# Patient Record
Sex: Female | Born: 1960 | Race: White | Hispanic: No | Marital: Married | Smoking: Former smoker
Health system: Southern US, Community
[De-identification: ages and names within clinical notes are randomized; demographics above are authoritative.]

---

## 2004-09-09 ENCOUNTER — Ambulatory Visit: Payer: Self-pay | Admitting: Family Medicine

## 2004-12-09 ENCOUNTER — Ambulatory Visit: Payer: Self-pay | Admitting: Family Medicine

## 2004-12-16 ENCOUNTER — Ambulatory Visit: Payer: Self-pay | Admitting: Family Medicine

## 2005-09-02 ENCOUNTER — Ambulatory Visit: Payer: Self-pay | Admitting: Family Medicine

## 2005-12-03 ENCOUNTER — Ambulatory Visit: Payer: Self-pay | Admitting: Family Medicine

## 2006-03-12 ENCOUNTER — Ambulatory Visit: Payer: Self-pay | Admitting: Family Medicine

## 2006-08-03 ENCOUNTER — Ambulatory Visit: Payer: Self-pay | Admitting: Family Medicine

## 2006-08-03 ENCOUNTER — Other Ambulatory Visit: Admission: RE | Admit: 2006-08-03 | Discharge: 2006-08-03 | Payer: Self-pay | Admitting: Family Medicine

## 2006-08-03 ENCOUNTER — Encounter (INDEPENDENT_AMBULATORY_CARE_PROVIDER_SITE_OTHER): Payer: Self-pay | Admitting: *Deleted

## 2006-08-03 LAB — CONVERTED CEMR LAB
ALT: 21 units/L (ref 0–40)
AST: 23 units/L (ref 0–37)
Alkaline Phosphatase: 54 units/L (ref 39–117)
BUN: 10 mg/dL (ref 6–23)
Basophils Relative: 0.4 % (ref 0.0–1.0)
Bilirubin, Direct: 0.1 mg/dL (ref 0.0–0.3)
CO2: 29 meq/L (ref 19–32)
Calcium: 9.1 mg/dL (ref 8.4–10.5)
Chloride: 100 meq/L (ref 96–112)
Cholesterol: 197 mg/dL (ref 0–200)
Eosinophils Relative: 2.7 % (ref 0.0–5.0)
GFR calc Af Amer: 117 mL/min
GFR calc non Af Amer: 97 mL/min
Glucose, Bld: 155 mg/dL — ABNORMAL HIGH (ref 70–99)
HDL: 37.8 mg/dL — ABNORMAL LOW (ref >39.0)
Hgb A1c MFr Bld: 9.9 %
Hgb A1c MFr Bld: 9.9 % — ABNORMAL HIGH (ref 4.6–6.0)
Microalb Creat Ratio: 1599.3 mg/g — ABNORMAL HIGH (ref 0.0–30.0)
Monocytes Relative: 6.6 % (ref 3.0–11.0)
Neutro Abs: 6.3 10*3/uL (ref 1.4–7.7)
Pap Smear: NORMAL
Platelets: 343 10*3/uL (ref 150–400)
RBC: 4.72 M/uL (ref 3.87–5.11)
TSH: 1.85 microintl units/mL (ref 0.35–5.50)
Total CHOL/HDL Ratio: 5.2
Total Protein: 6 g/dL (ref 6.0–8.3)
Triglycerides: 262 mg/dL (ref 0–149)
VLDL: 52 mg/dL — ABNORMAL HIGH (ref 0–40)
WBC: 9.7 10*3/uL (ref 4.5–10.5)

## 2006-08-10 ENCOUNTER — Encounter: Payer: Self-pay | Admitting: Family Medicine

## 2006-08-10 LAB — CONVERTED CEMR LAB: Protein, Ur: 3360 mg/24hr — ABNORMAL HIGH (ref 50–100)

## 2006-09-04 ENCOUNTER — Ambulatory Visit: Payer: Self-pay | Admitting: Family Medicine

## 2006-11-02 ENCOUNTER — Ambulatory Visit: Payer: Self-pay | Admitting: Family Medicine

## 2006-11-02 DIAGNOSIS — E7800 Pure hypercholesterolemia, unspecified: Secondary | ICD-10-CM

## 2006-11-02 DIAGNOSIS — E119 Type 2 diabetes mellitus without complications: Secondary | ICD-10-CM

## 2006-11-04 LAB — CONVERTED CEMR LAB
ALT: 22 units/L (ref 0–40)
AST: 17 units/L (ref 0–37)
Albumin: 3.1 g/dL — ABNORMAL LOW (ref 3.5–5.2)
BUN: 8 mg/dL (ref 6–23)
CO2: 28 meq/L (ref 19–32)
Calcium: 8.9 mg/dL (ref 8.4–10.5)
Chloride: 108 meq/L (ref 96–112)
Cholesterol: 181 mg/dL (ref 0–200)
Creatinine, Ser: 0.7 mg/dL (ref 0.4–1.2)
GFR calc non Af Amer: 97 mL/min
Hgb A1c MFr Bld: 9.3 % — ABNORMAL HIGH (ref 4.6–6.0)

## 2006-11-12 ENCOUNTER — Encounter: Payer: Self-pay | Admitting: Family Medicine

## 2006-11-12 DIAGNOSIS — E669 Obesity, unspecified: Secondary | ICD-10-CM | POA: Insufficient documentation

## 2006-11-12 DIAGNOSIS — L2089 Other atopic dermatitis: Secondary | ICD-10-CM

## 2006-11-12 DIAGNOSIS — R809 Proteinuria, unspecified: Secondary | ICD-10-CM | POA: Insufficient documentation

## 2006-11-12 DIAGNOSIS — F3289 Other specified depressive episodes: Secondary | ICD-10-CM | POA: Insufficient documentation

## 2006-11-12 DIAGNOSIS — F329 Major depressive disorder, single episode, unspecified: Secondary | ICD-10-CM | POA: Insufficient documentation

## 2006-11-12 DIAGNOSIS — L259 Unspecified contact dermatitis, unspecified cause: Secondary | ICD-10-CM | POA: Insufficient documentation

## 2006-11-12 DIAGNOSIS — Z87891 Personal history of nicotine dependence: Secondary | ICD-10-CM

## 2006-11-12 DIAGNOSIS — I10 Essential (primary) hypertension: Secondary | ICD-10-CM | POA: Insufficient documentation

## 2007-01-05 ENCOUNTER — Ambulatory Visit: Payer: Self-pay | Admitting: Family Medicine

## 2007-01-13 ENCOUNTER — Encounter: Payer: Self-pay | Admitting: Family Medicine

## 2007-03-05 ENCOUNTER — Emergency Department: Payer: Self-pay | Admitting: Emergency Medicine

## 2007-08-27 ENCOUNTER — Ambulatory Visit: Payer: Self-pay | Admitting: Family Medicine

## 2007-08-31 LAB — CONVERTED CEMR LAB
AST: 17 units/L (ref 0–37)
BUN: 10 mg/dL (ref 6–23)
Basophils Relative: 0.4 % (ref 0.0–1.0)
Calcium: 8.7 mg/dL (ref 8.4–10.5)
Creatinine, Ser: 0.8 mg/dL (ref 0.4–1.2)
Eosinophils Relative: 3.1 % (ref 0.0–5.0)
Glucose, Bld: 224 mg/dL — ABNORMAL HIGH (ref 70–99)
HCT: 42 % (ref 36.0–46.0)
HDL: 32.4 mg/dL — ABNORMAL LOW (ref >39.0)
Hemoglobin: 13.7 g/dL (ref 12.0–15.0)
Hgb A1c MFr Bld: 8.8 % — ABNORMAL HIGH (ref 4.6–6.0)
Monocytes Absolute: 0.5 10*3/uL (ref 0.1–1.0)
Monocytes Relative: 6.8 % (ref 3.0–12.0)
Platelets: 304 10*3/uL (ref 150–400)
Potassium: 4.5 meq/L (ref 3.5–5.1)
RBC: 4.64 M/uL (ref 3.87–5.11)
Total CHOL/HDL Ratio: 5.2
WBC: 6.9 10*3/uL (ref 4.5–10.5)

## 2007-10-15 ENCOUNTER — Encounter: Payer: Self-pay | Admitting: Family Medicine

## 2007-10-15 ENCOUNTER — Telehealth: Payer: Self-pay | Admitting: Family Medicine

## 2007-10-19 ENCOUNTER — Encounter: Payer: Self-pay | Admitting: Family Medicine

## 2008-01-14 ENCOUNTER — Ambulatory Visit: Payer: Self-pay | Admitting: Family Medicine

## 2008-02-08 ENCOUNTER — Telehealth (INDEPENDENT_AMBULATORY_CARE_PROVIDER_SITE_OTHER): Payer: Self-pay | Admitting: *Deleted

## 2008-03-02 ENCOUNTER — Encounter: Payer: Self-pay | Admitting: Family Medicine

## 2008-03-16 ENCOUNTER — Ambulatory Visit: Payer: Self-pay | Admitting: Family Medicine
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
## 2008-08-28 ENCOUNTER — Telehealth: Payer: Self-pay | Admitting: Family Medicine

## 2008-11-14 ENCOUNTER — Telehealth: Payer: Self-pay | Admitting: Family Medicine

## 2008-11-23 ENCOUNTER — Encounter: Payer: Self-pay | Admitting: Family Medicine

## 2008-11-24 ENCOUNTER — Encounter: Payer: Self-pay | Admitting: Family Medicine

## 2008-11-29 ENCOUNTER — Ambulatory Visit: Payer: Self-pay | Admitting: Family Medicine

## 2008-12-28 ENCOUNTER — Telehealth: Payer: Self-pay | Admitting: Family Medicine

## 2009-05-15 ENCOUNTER — Telehealth: Payer: Self-pay | Admitting: Family Medicine

## 2010-01-02 ENCOUNTER — Emergency Department: Payer: Self-pay | Admitting: Emergency Medicine

## 2010-02-06 ENCOUNTER — Ambulatory Visit: Payer: Self-pay | Admitting: Unknown Physician Specialty

## 2010-02-18 ENCOUNTER — Ambulatory Visit: Payer: Self-pay | Admitting: Unknown Physician Specialty

## 2010-08-08 ENCOUNTER — Ambulatory Visit: Payer: Self-pay | Admitting: Unknown Physician Specialty

## 2010-08-22 ENCOUNTER — Other Ambulatory Visit: Payer: Self-pay | Admitting: Family Medicine

## 2011-02-11 ENCOUNTER — Ambulatory Visit: Payer: Self-pay | Admitting: Unknown Physician Specialty

## 2011-05-22 IMAGING — US ULTRASOUND CORE BIOPSY
1 series · 12 of 12 positions shown · non-contrast
Comparison: none

REASON FOR EXAM: L THYROID NODULE
COMMENTS:

PROCEDURE:     US  - US GUIDED BX/ASPIRATION NOT BR  - February 18, 2010  [DATE]
RESULT:
HISTORY: Left thyroid nodule.

[Series 1: ultrasound core biopsy · 12 of 12 slices shown]
[im 1/12]
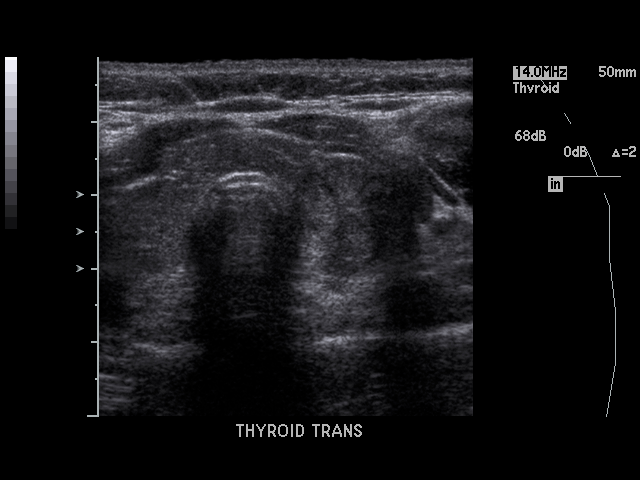
[im 2/12]
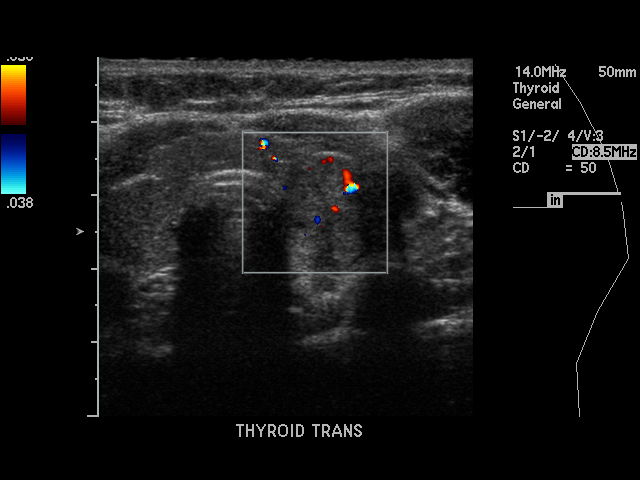
[im 3/12]
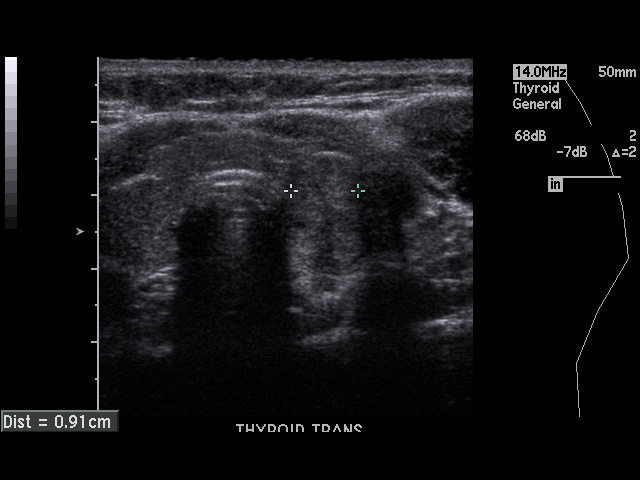
[im 4/12]
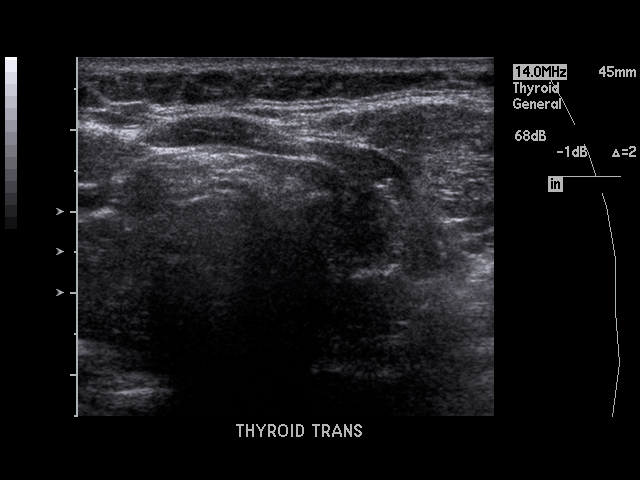
[im 5/12]
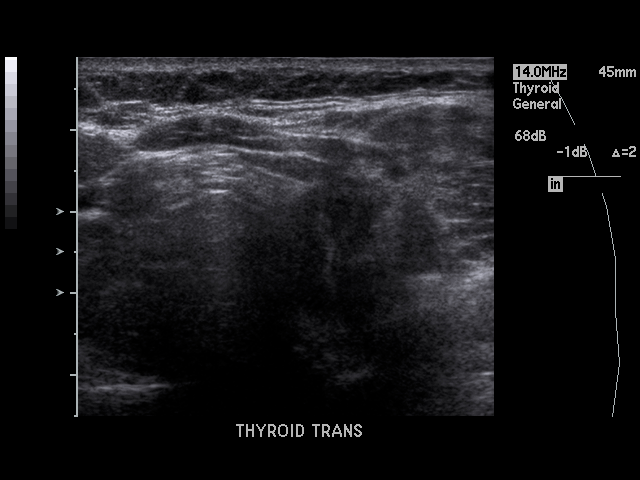
[im 6/12]
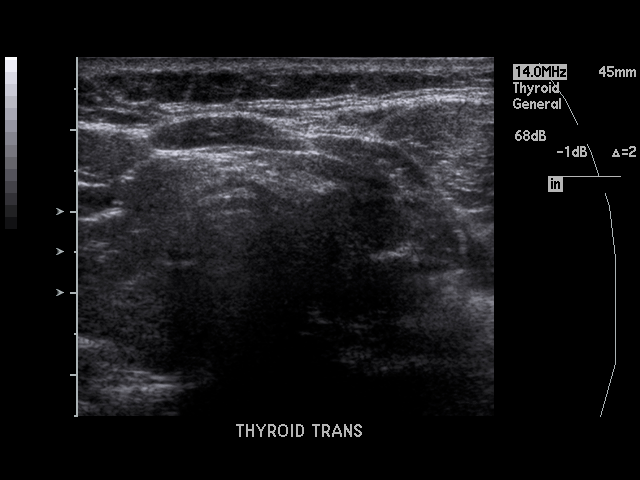
[im 7/12]
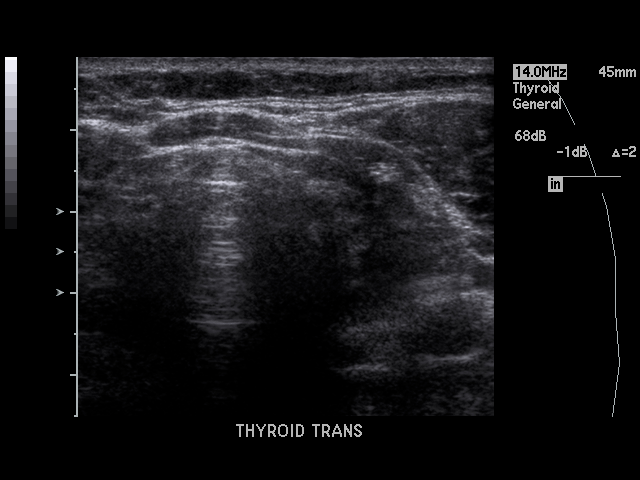
[im 8/12]
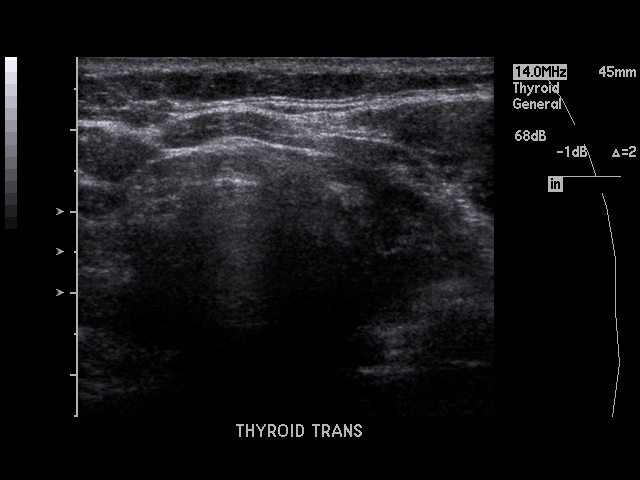
[im 9/12]
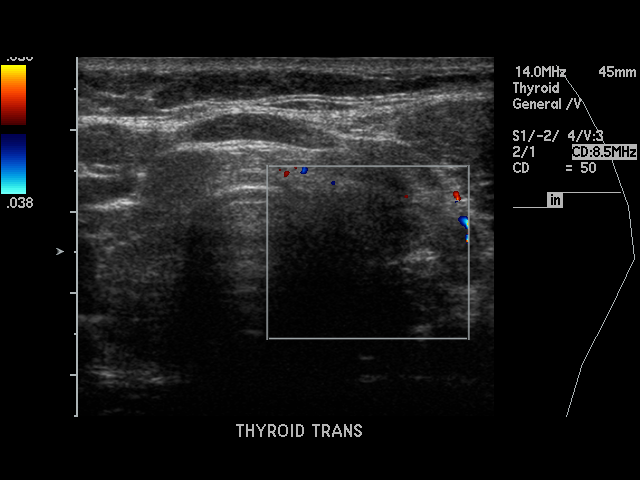
[im 10/12]
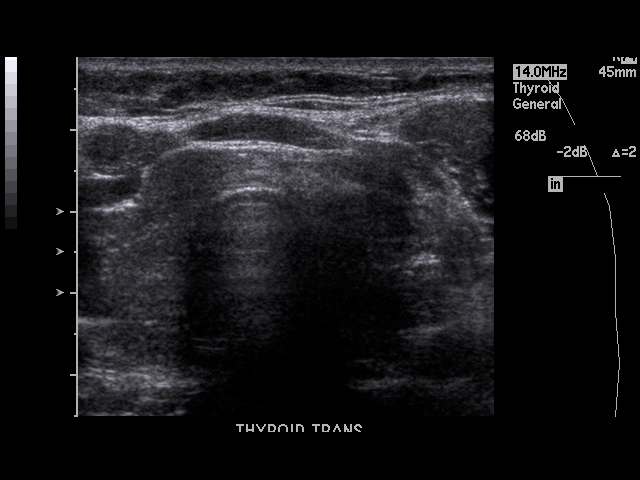
[im 11/12]
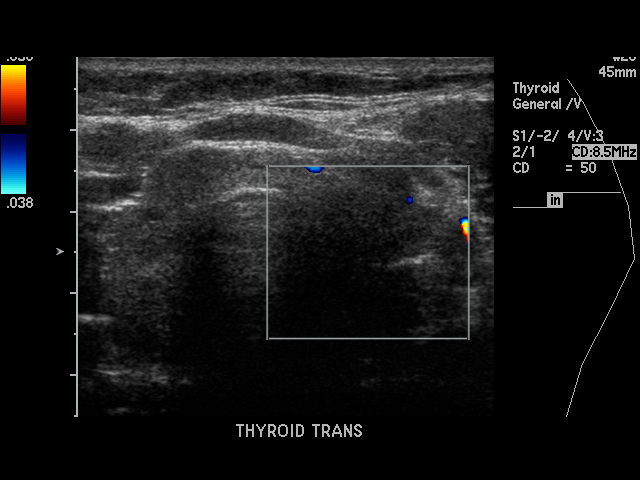
[im 12/12]
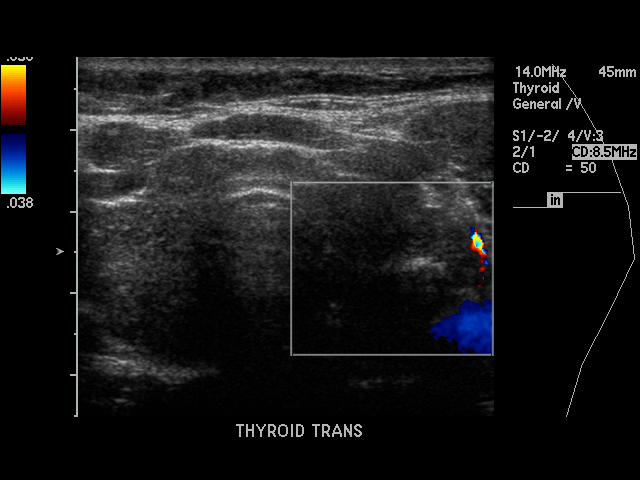

[12 of 12 positions shown; findings below may reference images not displayed]

PROCEDURE AND FINDINGS:  Following ultrasound localization, the patient was
sterilely prepped and draped. Local anesthesia is administered with 1%
Lidocaine. A 21 gauge needle was placed into the left thyroid mass and
multiple passes obtained in multiple portions of the thyroid mass. There
were no complications. Post procedural care was discussed with the patient.
IMPRESSION: Successful left thyroid nodule biopsy.

## 2012-02-20 ENCOUNTER — Ambulatory Visit: Payer: Self-pay | Admitting: Unknown Physician Specialty

## 2012-06-04 ENCOUNTER — Ambulatory Visit: Payer: Self-pay | Admitting: Unknown Physician Specialty

## 2012-06-04 LAB — CREATININE, SERUM
Creatinine: 0.78 mg/dL (ref 0.60–1.30)
EGFR (African American): >60

## 2012-07-12 ENCOUNTER — Ambulatory Visit: Payer: Self-pay | Admitting: Unknown Physician Specialty

## 2012-07-20 ENCOUNTER — Ambulatory Visit: Payer: Self-pay | Admitting: Unknown Physician Specialty

## 2012-07-20 LAB — CBC WITH DIFFERENTIAL/PLATELET
Basophil %: 0.6 %
Eosinophil #: 0.1 10*3/uL (ref 0.0–0.7)
HCT: 37.6 % (ref 35.0–47.0)
HGB: 12.7 g/dL (ref 12.0–16.0)
Lymphocyte #: 2.9 10*3/uL (ref 1.0–3.6)
MCHC: 33.7 g/dL (ref 32.0–36.0)
Monocyte #: 0.9 x10 3/mm (ref 0.2–0.9)
Monocyte %: 9 %
Neutrophil #: 6.2 10*3/uL (ref 1.4–6.5)
Platelet: 263 10*3/uL (ref 150–440)
RBC: 4.12 10*6/uL (ref 3.80–5.20)
RDW: 12.7 % (ref 11.5–14.5)
WBC: 10.1 10*3/uL (ref 3.6–11.0)

## 2012-07-20 LAB — COMPREHENSIVE METABOLIC PANEL
BUN: 12 mg/dL (ref 7–18)
Calcium, Total: 7.7 mg/dL — ABNORMAL LOW (ref 8.5–10.1)
Chloride: 103 mmol/L (ref 98–107)
Co2: 22 mmol/L (ref 21–32)
EGFR (Non-African Amer.): >60
Glucose: 304 mg/dL — ABNORMAL HIGH (ref 65–99)
Osmolality: 279 (ref 275–301)
Potassium: 4.4 mmol/L (ref 3.5–5.1)

## 2012-07-20 LAB — HEMOGLOBIN A1C: Hemoglobin A1C: 9.7 % — ABNORMAL HIGH (ref 4.2–6.3)

## 2012-07-20 LAB — TSH: Thyroid Stimulating Horm: 2.9 u[IU]/mL

## 2012-07-21 LAB — PATHOLOGY REPORT

## 2012-07-22 ENCOUNTER — Ambulatory Visit: Payer: Self-pay | Admitting: Unknown Physician Specialty

## 2012-07-28 ENCOUNTER — Ambulatory Visit: Payer: Self-pay | Admitting: Oncology

## 2012-08-24 ENCOUNTER — Ambulatory Visit: Payer: Self-pay | Admitting: Oncology

## 2012-09-03 LAB — HCG, QUANTITATIVE, PREGNANCY: Beta Hcg, Quant.: 2 m[IU]/mL

## 2012-09-20 LAB — CALCIUM: Calcium, Total: 9.1 mg/dL (ref 8.5–10.1)

## 2012-09-20 LAB — TSH: Thyroid Stimulating Horm: 86.7 u[IU]/mL — ABNORMAL HIGH

## 2012-09-23 ENCOUNTER — Ambulatory Visit: Payer: Self-pay | Admitting: Oncology

## 2012-10-11 LAB — TSH: Thyroid Stimulating Horm: 9.76 u[IU]/mL — ABNORMAL HIGH

## 2012-10-24 ENCOUNTER — Ambulatory Visit: Payer: Self-pay | Admitting: Oncology

## 2012-11-08 LAB — CALCIUM: Calcium, Total: 9 mg/dL (ref 8.5–10.1)

## 2012-11-23 ENCOUNTER — Ambulatory Visit: Payer: Self-pay | Admitting: Oncology

## 2013-02-07 ENCOUNTER — Ambulatory Visit: Payer: Self-pay | Admitting: Oncology

## 2013-02-07 LAB — COMPREHENSIVE METABOLIC PANEL
Albumin: 3.7 g/dL (ref 3.4–5.0)
Anion Gap: 7 (ref 7–16)
Bilirubin,Total: 0.3 mg/dL (ref 0.2–1.0)
Chloride: 102 mmol/L (ref 98–107)
EGFR (Non-African Amer.): >60
Glucose: 231 mg/dL — ABNORMAL HIGH (ref 65–99)
Osmolality: 277 (ref 275–301)
Potassium: 4.3 mmol/L (ref 3.5–5.1)
SGOT(AST): 36 U/L (ref 15–37)
SGPT (ALT): 49 U/L (ref 12–78)
Sodium: 135 mmol/L — ABNORMAL LOW (ref 136–145)
Total Protein: 7.6 g/dL (ref 6.4–8.2)

## 2013-02-07 LAB — CBC CANCER CENTER
Basophil #: 0 x10 3/mm (ref 0.0–0.1)
Basophil %: 0.6 %
Eosinophil %: 3.7 %
HCT: 40.9 % (ref 35.0–47.0)
Lymphocyte #: 3 x10 3/mm (ref 1.0–3.6)
Monocyte %: 8.7 %
Neutrophil %: 49.8 %
Platelet: 301 x10 3/mm (ref 150–440)
RBC: 4.55 10*6/uL (ref 3.80–5.20)
RDW: 12.1 % (ref 11.5–14.5)
WBC: 8.1 x10 3/mm (ref 3.6–11.0)

## 2013-02-23 ENCOUNTER — Ambulatory Visit: Payer: Self-pay | Admitting: Oncology

## 2013-03-15 LAB — TSH: Thyroid Stimulating Horm: 7.93 u[IU]/mL — ABNORMAL HIGH

## 2013-03-26 ENCOUNTER — Ambulatory Visit: Payer: Self-pay | Admitting: Oncology

## 2013-03-29 LAB — COMPREHENSIVE METABOLIC PANEL
Albumin: 3.4 g/dL (ref 3.4–5.0)
Co2: 26 mmol/L (ref 21–32)
Creatinine: 1.03 mg/dL (ref 0.60–1.30)
EGFR (African American): >60
Potassium: 4.9 mmol/L (ref 3.5–5.1)
Sodium: 132 mmol/L — ABNORMAL LOW (ref 136–145)

## 2013-03-29 LAB — CBC CANCER CENTER
Basophil #: 0.1 x10 3/mm (ref 0.0–0.1)
Eosinophil %: 2 %
HCT: 41.7 % (ref 35.0–47.0)
HGB: 13.6 g/dL (ref 12.0–16.0)
MCHC: 32.6 g/dL (ref 32.0–36.0)
MCV: 87 fL (ref 80–100)
Monocyte %: 9.7 %
Neutrophil #: 6.3 x10 3/mm (ref 1.4–6.5)
Neutrophil %: 59.8 %

## 2013-03-29 LAB — MAGNESIUM: Magnesium: 1.9 mg/dL

## 2013-03-29 LAB — TSH: Thyroid Stimulating Horm: 3.55 u[IU]/mL

## 2013-04-25 ENCOUNTER — Ambulatory Visit: Payer: Self-pay | Admitting: Oncology

## 2013-08-19 ENCOUNTER — Ambulatory Visit: Payer: Self-pay | Admitting: Oncology

## 2013-08-22 LAB — COMPREHENSIVE METABOLIC PANEL
ALBUMIN: 3.5 g/dL (ref 3.4–5.0)
AST: 20 U/L (ref 15–37)
Alkaline Phosphatase: 120 U/L — ABNORMAL HIGH
Anion Gap: 14 (ref 7–16)
BUN: 19 mg/dL — AB (ref 7–18)
Bilirubin,Total: 0.4 mg/dL (ref 0.2–1.0)
CALCIUM: 8.9 mg/dL (ref 8.5–10.1)
CREATININE: 0.92 mg/dL (ref 0.60–1.30)
Chloride: 98 mmol/L (ref 98–107)
Co2: 22 mmol/L (ref 21–32)
EGFR (African American): >60
EGFR (Non-African Amer.): >60
Glucose: 232 mg/dL — ABNORMAL HIGH (ref 65–99)
OSMOLALITY: 278 (ref 275–301)
Potassium: 4 mmol/L (ref 3.5–5.1)
SGPT (ALT): 24 U/L (ref 12–78)
SODIUM: 134 mmol/L — AB (ref 136–145)
Total Protein: 7.8 g/dL (ref 6.4–8.2)

## 2013-08-22 LAB — CBC CANCER CENTER
BASOS PCT: 0.8 %
Basophil #: 0.1 x10 3/mm (ref 0.0–0.1)
EOS PCT: 2.8 %
Eosinophil #: 0.2 x10 3/mm (ref 0.0–0.7)
HCT: 43.5 % (ref 35.0–47.0)
HGB: 14.5 g/dL (ref 12.0–16.0)
Lymphocyte #: 2.2 x10 3/mm (ref 1.0–3.6)
Lymphocyte %: 27.1 %
MCH: 29.2 pg (ref 26.0–34.0)
MCHC: 33.4 g/dL (ref 32.0–36.0)
MCV: 87 fL (ref 80–100)
Monocyte #: 0.6 x10 3/mm (ref 0.2–0.9)
Monocyte %: 7.6 %
NEUTROS ABS: 5 x10 3/mm (ref 1.4–6.5)
NEUTROS PCT: 61.7 %
Platelet: 301 x10 3/mm (ref 150–440)
RBC: 4.99 10*6/uL (ref 3.80–5.20)
RDW: 16 % — ABNORMAL HIGH (ref 11.5–14.5)
WBC: 8.1 x10 3/mm (ref 3.6–11.0)

## 2013-08-22 LAB — TSH: Thyroid Stimulating Horm: 1.07 u[IU]/mL

## 2013-08-24 ENCOUNTER — Ambulatory Visit: Payer: Self-pay | Admitting: Oncology

## 2013-09-23 ENCOUNTER — Ambulatory Visit: Payer: Self-pay | Admitting: Oncology

## 2013-11-08 DIAGNOSIS — H15109 Unspecified episcleritis, unspecified eye: Secondary | ICD-10-CM | POA: Insufficient documentation

## 2014-02-20 ENCOUNTER — Ambulatory Visit: Payer: Self-pay | Admitting: Oncology

## 2014-02-20 LAB — CBC CANCER CENTER
Basophil #: 0 x10 3/mm (ref 0.0–0.1)
Basophil %: 0.6 %
EOS ABS: 0.2 x10 3/mm (ref 0.0–0.7)
Eosinophil %: 3.1 %
HCT: 43.8 % (ref 35.0–47.0)
HGB: 14.5 g/dL (ref 12.0–16.0)
LYMPHS ABS: 2.1 x10 3/mm (ref 1.0–3.6)
Lymphocyte %: 31.5 %
MCH: 31.2 pg (ref 26.0–34.0)
MCHC: 33.2 g/dL (ref 32.0–36.0)
MCV: 94 fL (ref 80–100)
MONOS PCT: 10.1 %
Monocyte #: 0.7 x10 3/mm (ref 0.2–0.9)
NEUTROS PCT: 54.7 %
Neutrophil #: 3.7 x10 3/mm (ref 1.4–6.5)
Platelet: 348 x10 3/mm (ref 150–440)
RBC: 4.66 10*6/uL (ref 3.80–5.20)
RDW: 14.1 % (ref 11.5–14.5)
WBC: 6.7 x10 3/mm (ref 3.6–11.0)

## 2014-02-20 LAB — COMPREHENSIVE METABOLIC PANEL
ALBUMIN: 3.5 g/dL (ref 3.4–5.0)
ALT: 42 U/L
AST: 22 U/L (ref 15–37)
Alkaline Phosphatase: 104 U/L
Anion Gap: 13 (ref 7–16)
BUN: 17 mg/dL (ref 7–18)
Bilirubin,Total: 0.5 mg/dL (ref 0.2–1.0)
CALCIUM: 8.9 mg/dL (ref 8.5–10.1)
Chloride: 99 mmol/L (ref 98–107)
Co2: 24 mmol/L (ref 21–32)
Creatinine: 1.01 mg/dL (ref 0.60–1.30)
EGFR (African American): >60
EGFR (Non-African Amer.): >60
GLUCOSE: 374 mg/dL — AB (ref 65–99)
Osmolality: 289 (ref 275–301)
POTASSIUM: 4.6 mmol/L (ref 3.5–5.1)
Sodium: 136 mmol/L (ref 136–145)
Total Protein: 7.4 g/dL (ref 6.4–8.2)

## 2014-02-20 LAB — TSH: Thyroid Stimulating Horm: 0.13 u[IU]/mL — ABNORMAL LOW

## 2014-02-23 ENCOUNTER — Ambulatory Visit: Payer: Self-pay | Admitting: Oncology

## 2014-03-26 ENCOUNTER — Ambulatory Visit: Payer: Self-pay | Admitting: Oncology

## 2014-03-30 ENCOUNTER — Emergency Department: Payer: Self-pay | Admitting: Emergency Medicine

## 2014-03-30 LAB — CBC WITH DIFFERENTIAL/PLATELET
BASOS PCT: 1.1 %
Basophil #: 0.1 10*3/uL (ref 0.0–0.1)
EOS ABS: 0.2 10*3/uL (ref 0.0–0.7)
Eosinophil %: 3.6 %
HCT: 43.8 % (ref 35.0–47.0)
HGB: 14.8 g/dL (ref 12.0–16.0)
LYMPHS ABS: 2.6 10*3/uL (ref 1.0–3.6)
LYMPHS PCT: 38.3 %
MCH: 31.8 pg (ref 26.0–34.0)
MCHC: 33.9 g/dL (ref 32.0–36.0)
MCV: 94 fL (ref 80–100)
Monocyte #: 0.7 x10 3/mm (ref 0.2–0.9)
Monocyte %: 10.5 %
NEUTROS ABS: 3.1 10*3/uL (ref 1.4–6.5)
NEUTROS PCT: 46.5 %
Platelet: 259 10*3/uL (ref 150–440)
RBC: 4.67 10*6/uL (ref 3.80–5.20)
RDW: 13.8 % (ref 11.5–14.5)
WBC: 6.7 10*3/uL (ref 3.6–11.0)

## 2014-03-30 LAB — BASIC METABOLIC PANEL
Anion Gap: 11 (ref 7–16)
BUN: 19 mg/dL — AB (ref 7–18)
CALCIUM: 9 mg/dL (ref 8.5–10.1)
CHLORIDE: 103 mmol/L (ref 98–107)
Co2: 24 mmol/L (ref 21–32)
Creatinine: 0.82 mg/dL (ref 0.60–1.30)
EGFR (Non-African Amer.): >60
GLUCOSE: 192 mg/dL — AB (ref 65–99)
OSMOLALITY: 283 (ref 275–301)
Potassium: 3.7 mmol/L (ref 3.5–5.1)
SODIUM: 138 mmol/L (ref 136–145)

## 2014-03-30 LAB — TROPONIN I: Troponin-I: <0.02 ng/mL

## 2014-03-31 LAB — URINALYSIS, COMPLETE
BILIRUBIN, UR: NEGATIVE
Blood: NEGATIVE
Glucose,UR: >=500 mg/dL (ref 0–75)
Leukocyte Esterase: NEGATIVE
Nitrite: NEGATIVE
PROTEIN: NEGATIVE
Ph: 5 (ref 4.5–8.0)
RBC, UR: NONE SEEN /HPF (ref 0–5)
SPECIFIC GRAVITY: 1.014 (ref 1.003–1.030)
SQUAMOUS EPITHELIAL: NONE SEEN
WBC UR: <1 /HPF (ref 0–5)

## 2014-03-31 LAB — TROPONIN I: Troponin-I: <0.02 ng/mL

## 2014-12-04 ENCOUNTER — Other Ambulatory Visit: Payer: Self-pay | Admitting: *Deleted

## 2014-12-04 DIAGNOSIS — C73 Malignant neoplasm of thyroid gland: Secondary | ICD-10-CM

## 2014-12-06 ENCOUNTER — Inpatient Hospital Stay: Payer: 59 | Attending: Oncology

## 2014-12-06 DIAGNOSIS — E669 Obesity, unspecified: Secondary | ICD-10-CM | POA: Diagnosis not present

## 2014-12-06 DIAGNOSIS — E119 Type 2 diabetes mellitus without complications: Secondary | ICD-10-CM | POA: Insufficient documentation

## 2014-12-06 DIAGNOSIS — Z79899 Other long term (current) drug therapy: Secondary | ICD-10-CM | POA: Diagnosis not present

## 2014-12-06 DIAGNOSIS — Z85850 Personal history of malignant neoplasm of thyroid: Secondary | ICD-10-CM | POA: Insufficient documentation

## 2014-12-06 DIAGNOSIS — Z87891 Personal history of nicotine dependence: Secondary | ICD-10-CM | POA: Insufficient documentation

## 2014-12-06 DIAGNOSIS — R978 Other abnormal tumor markers: Secondary | ICD-10-CM | POA: Diagnosis not present

## 2014-12-06 DIAGNOSIS — C73 Malignant neoplasm of thyroid gland: Secondary | ICD-10-CM

## 2014-12-06 LAB — COMPREHENSIVE METABOLIC PANEL
ALT: 33 U/L (ref 14–54)
AST: 27 U/L (ref 15–41)
Albumin: 4 g/dL (ref 3.5–5.0)
Alkaline Phosphatase: 84 U/L (ref 38–126)
Anion gap: 9 (ref 5–15)
BILIRUBIN TOTAL: 0.9 mg/dL (ref 0.3–1.2)
BUN: 21 mg/dL — ABNORMAL HIGH (ref 6–20)
CHLORIDE: 102 mmol/L (ref 101–111)
CO2: 22 mmol/L (ref 22–32)
Calcium: 8.5 mg/dL — ABNORMAL LOW (ref 8.9–10.3)
Creatinine, Ser: 0.85 mg/dL (ref 0.44–1.00)
GFR calc non Af Amer: >60 mL/min (ref >60)
Glucose, Bld: 141 mg/dL — ABNORMAL HIGH (ref 65–99)
POTASSIUM: 4 mmol/L (ref 3.5–5.1)
Sodium: 133 mmol/L — ABNORMAL LOW (ref 135–145)
Total Protein: 7.5 g/dL (ref 6.5–8.1)

## 2014-12-06 LAB — CBC WITH DIFFERENTIAL/PLATELET
BASOS ABS: 0.1 10*3/uL (ref 0–0.1)
Basophils Relative: 1 %
EOS ABS: 0.4 10*3/uL (ref 0–0.7)
Eosinophils Relative: 5 %
HCT: 45.6 % (ref 35.0–47.0)
Hemoglobin: 14.9 g/dL (ref 12.0–16.0)
Lymphocytes Relative: 39 %
Lymphs Abs: 2.9 10*3/uL (ref 1.0–3.6)
MCH: 29.5 pg (ref 26.0–34.0)
MCHC: 32.7 g/dL (ref 32.0–36.0)
MCV: 90.3 fL (ref 80.0–100.0)
Monocytes Absolute: 0.7 10*3/uL (ref 0.2–0.9)
Monocytes Relative: 9 %
Neutro Abs: 3.5 10*3/uL (ref 1.4–6.5)
Neutrophils Relative %: 46 %
Platelets: 279 10*3/uL (ref 150–440)
RBC: 5.05 MIL/uL (ref 3.80–5.20)
RDW: 12.7 % (ref 11.5–14.5)
WBC: 7.5 10*3/uL (ref 3.6–11.0)

## 2014-12-06 LAB — TSH: TSH: 0.05 u[IU]/mL — ABNORMAL LOW (ref 0.350–4.500)

## 2014-12-07 LAB — T4: T4, Total: 13.8 ug/dL — ABNORMAL HIGH (ref 4.5–12.0)

## 2014-12-13 ENCOUNTER — Inpatient Hospital Stay (HOSPITAL_BASED_OUTPATIENT_CLINIC_OR_DEPARTMENT_OTHER): Payer: 59 | Admitting: Oncology

## 2014-12-13 VITALS — BP 139/84 | HR 89 | Temp 96.0°F | Wt 257.1 lb

## 2014-12-13 DIAGNOSIS — Z79899 Other long term (current) drug therapy: Secondary | ICD-10-CM | POA: Diagnosis not present

## 2014-12-13 DIAGNOSIS — R978 Other abnormal tumor markers: Secondary | ICD-10-CM

## 2014-12-13 DIAGNOSIS — C73 Malignant neoplasm of thyroid gland: Secondary | ICD-10-CM

## 2014-12-13 DIAGNOSIS — E119 Type 2 diabetes mellitus without complications: Secondary | ICD-10-CM

## 2014-12-13 DIAGNOSIS — Z85850 Personal history of malignant neoplasm of thyroid: Secondary | ICD-10-CM

## 2014-12-13 DIAGNOSIS — E669 Obesity, unspecified: Secondary | ICD-10-CM

## 2014-12-13 DIAGNOSIS — Z87891 Personal history of nicotine dependence: Secondary | ICD-10-CM

## 2014-12-13 MED ORDER — LEVOTHYROXINE SODIUM 112 MCG PO TABS: 112.0000 ug | ORAL_TABLET | Freq: Every day | ORAL | Status: DC

## 2014-12-13 MED ORDER — LEVOTHYROXINE SODIUM 50 MCG PO TABS: 50.0000 ug | ORAL_TABLET | Freq: Every day | ORAL | Status: DC

## 2014-12-13 NOTE — Progress Notes (Signed)
Patient does not have living will.  Declined information.  Former smoker. 

## 2014-12-15 ENCOUNTER — Encounter: Payer: Self-pay | Admitting: Oncology

## 2014-12-15 DIAGNOSIS — E119 Type 2 diabetes mellitus without complications: Secondary | ICD-10-CM | POA: Insufficient documentation

## 2014-12-15 DIAGNOSIS — L409 Psoriasis, unspecified: Secondary | ICD-10-CM | POA: Insufficient documentation

## 2014-12-15 NOTE — Progress Notes (Signed)
[place] @ [facility] Telephone:(336) [phone]  Fax:(336) [phone]     Kristi Garcia OB: April 13, 1961  MR#: [id number]  ENM#:[id number]  Patient Care Team: Abner Greenspan, MD as PCP - General  CHIEF COMPLAINT:  Chief Complaint  Patient presents with  . Follow-up    . 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 [id number] Comorbid conditions obesity hyprtrtension  diabetes type 2 2. Patient had radioactive iodine treatmenton April 18 of 2014   INTERVAL HISTORY:  54 year old lady with history of thyroid cancer came today for further follow-up.  Patient is back to the work.  Taking Synthroid 175 g according to her menses reduce his Synthroid C cannot walk.  Dr. Ginette Pitman i  Has  Increased  Synthroid 175 g  REVIEW OF SYSTEMS:    general status: Patient is feeling much stronger.  No change in a performance status.  No chills.  No fever. HEENT:   No evidence of stomatitis Lungs: No cough or shortness of breath Cardiac: No chest pain or paroxysmal nocturnal dyspnea GI: No nausea no vomiting no diarrhea no abdominal pain Skin: No rash Lower extremity no swelling Neurological system: No tingling.  No numbness.  No other focal signs Musculoskeletal system no bony pains  As per HPI. Otherwise, a complete review of systems is negatve.  Significant History/PMH:   kidney disease:    Glaucoma:    HTN:    DM:    Thyroidectomy:    Tubal Ligation:   Preventive Screening:  Has patient had any of the following test? Mammography  Pap Smear   Last Mammography: 2015   Last Pap Smear: 02/2012   Smoking History: Smoking History quit smoking 1992.  PFSH: Comments: No family history of colorectal cancer, breast cancer, or ovarian cancer.  Social History: negative alcohol, negative tobacco  Additional Past Medical and Surgical History: As mentioned above   ADVANCED DIRECTIVES:    Patient does have advance healthcare directive, Patient   does not desire to make any changes HEALTH MAINTENANCE: History  Substance Use Topics  . Smoking status: Former Research scientist (life sciences)  . Smokeless tobacco: Not on file  . Alcohol Use: Not on file         OBJECTIVE:  Filed Vitals:   12/13/14 1538  BP: 139/84  Pulse: 89  Temp: 96 F (35.6 C)     Body mass index is 42.78 kg/(m^2).    ECOG FS:1 - Symptomatic but completely ambulatory  PHYSICAL EXAM: Generalstatus: Performance status is good.  Patient has not lost significant weight Moderately obese lady with a BMI of 43 HEENT: No evidence of stomatitis. Sclera and conjunctivae :: No jaundice.   pale looking. Lungs: Air  entry equal on both sides.  No rhonchi.  No rales.  Cardiac: Heart sounds are normal.  No pericardial rub.  No murmur. Lymphatic system: Cervical, axillary, inguinal, lymph nodes not palpable GI: Abdomen is soft.  No ascites.  Liver spleen not palpable.  No tenderness.  Bowel sounds are within normal limit Lower extremity: No edema Neurological system: Higher functions, cranial nerves intact no evidence of peripheral neuropathy. Skin: No rash.  No ecchymosis.Marland Kitchen   LAB RESULTS:  No visits with results within 2 Day(s) from this visit. Latest known visit with results is:  Appointment on 12/06/2014  Component Date Value Ref Range Status  . WBC 12/06/2014 7.5  3.6 - 11.0 K/uL Final  . RBC 12/06/2014 5.05  3.80 -  5.20 MIL/uL Final  . Hemoglobin 12/06/2014 14.9  12.0 - 16.0 g/dL Final  . HCT 12/06/2014 45.6  35.0 - 47.0 % Final  . MCV 12/06/2014 90.3  80.0 - 100.0 fL Final  . MCH 12/06/2014 29.5  26.0 - 34.0 pg Final  . MCHC 12/06/2014 32.7  32.0 - 36.0 g/dL Final  . RDW 12/06/2014 12.7  11.5 - 14.5 % Final  . Platelets 12/06/2014 279  150 - 440 K/uL Final  . Neutrophils Relative % 12/06/2014 46   Final  . Neutro Abs 12/06/2014 3.5  1.4 - 6.5 K/uL Final  . Lymphocytes Relative 12/06/2014 39   Final  . Lymphs Abs  12/06/2014 2.9  1.0 - 3.6 K/uL Final  . Monocytes Relative 12/06/2014 9   Final  . Monocytes Absolute 12/06/2014 0.7  0.2 - 0.9 K/uL Final  . Eosinophils Relative 12/06/2014 5   Final  . Eosinophils Absolute 12/06/2014 0.4  0 - 0.7 K/uL Final  . Basophils Relative 12/06/2014 1   Final  . Basophils Absolute 12/06/2014 0.1  0 - 0.1 K/uL Final  . Sodium 12/06/2014 133* 135 - 145 mmol/L Final  . Potassium 12/06/2014 4.0  3.5 - 5.1 mmol/L Final  . Chloride 12/06/2014 102  101 - 111 mmol/L Final  . CO2 12/06/2014 22  22 - 32 mmol/L Final  . Glucose, Bld 12/06/2014 141* 65 - 99 mg/dL Final  . BUN 12/06/2014 21* 6 - 20 mg/dL Final  . Creatinine, Ser 12/06/2014 0.85  0.44 - 1.00 mg/dL Final  . Calcium 12/06/2014 8.5* 8.9 - 10.3 mg/dL Final  . Total Protein 12/06/2014 7.5  6.5 - 8.1 g/dL Final  . Albumin 12/06/2014 4.0  3.5 - 5.0 g/dL Final  . AST 12/06/2014 27  15 - 41 U/L Final  . ALT 12/06/2014 33  14 - 54 U/L Final  . Alkaline Phosphatase 12/06/2014 84  38 - 126 U/L Final  . Total Bilirubin 12/06/2014 0.9  0.3 - 1.2 mg/dL Final  . GFR calc non Af Amer 12/06/2014 >60  >60 mL/min Final  . GFR calc Af Amer 12/06/2014 >60  >60 mL/min Final   Comment: (NOTE) The eGFR has been calculated using the CKD EPI equation. This calculation has not been validated in all clinical situations. eGFR's persistently <60 mL/min signify possible Chronic Kidney Disease.   . Anion gap 12/06/2014 9  5 - 15 Final  . T4, Total 12/06/2014 13.8* 4.5 - 12.0 ug/dL Final   Comment: (NOTE) Performed At: [facility] [place], [place] [id number] Lindon Romp MD EV:[id number]   . TSH 12/06/2014 0.050* 0.350 - 4.500 uIU/mL Final      ASSESSMENT: Carcinoma of the thyroid status post radioactive I131 ablation T4 is slightly elevated to 13.8 Tried to convince her to decrease Synthroid dose the patient is adamant that reducing dose she  cannot walk.  After long explanation we decided to reduce  dose to 162 g Recheck T4 TSH in 4 weeks  MEDICAL DECISION MAKING: all lab data has been reviewed. T4 is slightly elevated Reduced dose of Synthroid to 162 g  Patient expressed understanding and was in agreement with this plan. She also understands that She can call clinic at any time with any questions, concerns, or complaints.    No matching staging information was found for the patient.  Forest Gleason, MD   12/15/2014 5:26 PM

## 2015-06-13 ENCOUNTER — Inpatient Hospital Stay: Payer: BLUE CROSS/BLUE SHIELD | Attending: Oncology

## 2015-06-13 DIAGNOSIS — Z85850 Personal history of malignant neoplasm of thyroid: Secondary | ICD-10-CM | POA: Diagnosis present

## 2015-06-13 DIAGNOSIS — I10 Essential (primary) hypertension: Secondary | ICD-10-CM | POA: Insufficient documentation

## 2015-06-13 DIAGNOSIS — Z87891 Personal history of nicotine dependence: Secondary | ICD-10-CM | POA: Insufficient documentation

## 2015-06-13 DIAGNOSIS — E119 Type 2 diabetes mellitus without complications: Secondary | ICD-10-CM | POA: Diagnosis not present

## 2015-06-13 DIAGNOSIS — E669 Obesity, unspecified: Secondary | ICD-10-CM | POA: Diagnosis not present

## 2015-06-13 DIAGNOSIS — Z79899 Other long term (current) drug therapy: Secondary | ICD-10-CM | POA: Diagnosis not present

## 2015-06-13 DIAGNOSIS — C73 Malignant neoplasm of thyroid gland: Secondary | ICD-10-CM

## 2015-06-13 DIAGNOSIS — Z923 Personal history of irradiation: Secondary | ICD-10-CM | POA: Insufficient documentation

## 2015-06-13 DIAGNOSIS — E890 Postprocedural hypothyroidism: Secondary | ICD-10-CM | POA: Insufficient documentation

## 2015-06-13 LAB — CBC WITH DIFFERENTIAL/PLATELET
Basophils Absolute: 0.1 10*3/uL (ref 0–0.1)
Basophils Relative: 1 %
EOS ABS: 0.2 10*3/uL (ref 0–0.7)
Eosinophils Relative: 3 %
HEMATOCRIT: 45.2 % (ref 35.0–47.0)
HEMOGLOBIN: 15.2 g/dL (ref 12.0–16.0)
LYMPHS ABS: 2.9 10*3/uL (ref 1.0–3.6)
LYMPHS PCT: 37 %
MCH: 28.7 pg (ref 26.0–34.0)
MCHC: 33.6 g/dL (ref 32.0–36.0)
MCV: 85.5 fL (ref 80.0–100.0)
MONOS PCT: 9 %
Monocytes Absolute: 0.7 10*3/uL (ref 0.2–0.9)
NEUTROS PCT: 50 %
Neutro Abs: 4 10*3/uL (ref 1.4–6.5)
Platelets: 279 10*3/uL (ref 150–440)
RBC: 5.28 MIL/uL — AB (ref 3.80–5.20)
RDW: 13.1 % (ref 11.5–14.5)
WBC: 7.9 10*3/uL (ref 3.6–11.0)

## 2015-06-13 LAB — COMPREHENSIVE METABOLIC PANEL
ALK PHOS: 84 U/L (ref 38–126)
ALT: 26 U/L (ref 14–54)
ANION GAP: 9 (ref 5–15)
AST: 17 U/L (ref 15–41)
Albumin: 4.1 g/dL (ref 3.5–5.0)
BILIRUBIN TOTAL: 0.6 mg/dL (ref 0.3–1.2)
BUN: 26 mg/dL — ABNORMAL HIGH (ref 6–20)
CALCIUM: 9.6 mg/dL (ref 8.9–10.3)
CO2: 26 mmol/L (ref 22–32)
Chloride: 102 mmol/L (ref 101–111)
Creatinine, Ser: 0.77 mg/dL (ref 0.44–1.00)
GFR calc non Af Amer: >60 mL/min (ref >60)
Glucose, Bld: 162 mg/dL — ABNORMAL HIGH (ref 65–99)
POTASSIUM: 4 mmol/L (ref 3.5–5.1)
SODIUM: 137 mmol/L (ref 135–145)
Total Protein: 7.8 g/dL (ref 6.5–8.1)

## 2015-06-13 LAB — TSH: TSH: 0.098 u[IU]/mL — AB (ref 0.350–4.500)

## 2015-06-18 LAB — THYROGLOBULIN LEVEL: Thyroglobulin: <2 ng/mL

## 2015-06-18 LAB — T4: T4, Total: 13.6 ug/dL — ABNORMAL HIGH (ref 4.5–12.0)

## 2015-06-20 ENCOUNTER — Encounter: Payer: Self-pay | Admitting: Oncology

## 2015-06-20 ENCOUNTER — Inpatient Hospital Stay (HOSPITAL_BASED_OUTPATIENT_CLINIC_OR_DEPARTMENT_OTHER): Payer: BLUE CROSS/BLUE SHIELD | Admitting: Oncology

## 2015-06-20 VITALS — BP 117/73 | HR 82 | Temp 96.3°F | Resp 18 | Wt 249.8 lb

## 2015-06-20 DIAGNOSIS — I10 Essential (primary) hypertension: Secondary | ICD-10-CM

## 2015-06-20 DIAGNOSIS — E669 Obesity, unspecified: Secondary | ICD-10-CM

## 2015-06-20 DIAGNOSIS — Z85850 Personal history of malignant neoplasm of thyroid: Secondary | ICD-10-CM | POA: Diagnosis not present

## 2015-06-20 DIAGNOSIS — C73 Malignant neoplasm of thyroid gland: Secondary | ICD-10-CM

## 2015-06-20 DIAGNOSIS — Z923 Personal history of irradiation: Secondary | ICD-10-CM

## 2015-06-20 DIAGNOSIS — Z87891 Personal history of nicotine dependence: Secondary | ICD-10-CM

## 2015-06-20 DIAGNOSIS — E119 Type 2 diabetes mellitus without complications: Secondary | ICD-10-CM

## 2015-06-20 DIAGNOSIS — E890 Postprocedural hypothyroidism: Secondary | ICD-10-CM

## 2015-06-20 DIAGNOSIS — Z79899 Other long term (current) drug therapy: Secondary | ICD-10-CM

## 2015-06-20 MED ORDER — LEVOTHYROXINE SODIUM 25 MCG PO TABS
50.0000 ug | ORAL_TABLET | Freq: Every day | ORAL | Status: AC
Start: 2015-06-20 — End: ?

## 2015-06-20 MED ORDER — LEVOTHYROXINE SODIUM 100 MCG PO TABS: 100.0000 ug | ORAL_TABLET | Freq: Every day | ORAL | Status: AC

## 2015-06-20 NOTE — Progress Notes (Signed)
[place] @ [facility] Telephone:(336) [phone]  Fax:(336) [phone]     Kristi Garcia OB: 1961/05/19  MR#: [id number]  QBV#:[id number]  Patient Care Team: Abner Greenspan, MD as PCP - General  CHIEF COMPLAINT:  Chief Complaint  Patient presents with  . Thyroid Cancer    . 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 [id number] Comorbid conditions obesity hyprtrtension  diabetes type 2 2. Patient had radioactive iodine treatmenton April 18 of 2014   INTERVAL HISTORY:  55 year old lady with history of thyroid cancer came today for further follow-up.  Patient is back to the work.  Taking Synthroid 175 g according to her menses reduce his Synthroid   Dr. Ginette Pitman i  Has  Increased  Synthroid 175 g Patient is here for ongoing evaluation and treatment consideration Dose of the Synthroid was decreased to 162 in the past by Amy.  Did not have any significant bony pains.  She is working full-time  REVIEW OF SYSTEMS:    general status: Patient is feeling much stronger.  No change in a performance status.  No chills.  No fever. HEENT:   No evidence of stomatitis Lungs: No cough or shortness of breath Cardiac: No chest pain or paroxysmal nocturnal dyspnea GI: No nausea no vomiting no diarrhea no abdominal pain Skin: No rash Lower extremity no swelling Neurological system: No tingling.  No numbness.  No other focal signs Musculoskeletal system no bony pains  As per HPI. Otherwise, a complete review of systems is negatve.  Significant History/PMH:   kidney disease:    Glaucoma:    HTN:    DM:    Thyroidectomy:    Tubal Ligation:   Preventive Screening:  Has patient had any of the following test? Mammography  Pap Smear   Last Mammography: 2015   Last Pap Smear: 02/2012   Smoking History: Smoking History quit smoking 1992.  PFSH: Comments: No family history of colorectal  cancer, breast cancer, or ovarian cancer.  Social History: negative alcohol, negative tobacco  Additional Past Medical and Surgical History: As mentioned above   ADVANCED DIRECTIVES:  Patient does have advance healthcare directive, Patient   does not desire to make any changes HEALTH MAINTENANCE: Social History  Substance Use Topics  . Smoking status: Former Research scientist (life sciences)  . Smokeless tobacco: None  . Alcohol Use: None         OBJECTIVE:  Filed Vitals:   06/20/15 1536  BP: 117/73  Pulse: 82  Temp: 96.3 F (35.7 C)  Resp: 18     Body mass index is 41.57 kg/(m^2).    ECOG FS:1 - Symptomatic but completely ambulatory  PHYSICAL EXAM: Generalstatus: Performance status is good.  Patient has not lost significant weight Moderately obese lady with a BMI of 43 HEENT: No evidence of stomatitis. Sclera and conjunctivae :: No jaundice.   pale looking. Lungs: Air  entry equal on both sides.  No rhonchi.  No rales.  Cardiac: Heart sounds are normal.  No pericardial rub.  No murmur. Lymphatic system: Cervical, axillary, inguinal, lymph nodes not palpable GI: Abdomen is soft.  No ascites.  Liver spleen not palpable.  No tenderness.  Bowel sounds are within normal limit Lower extremity: No edema Neurological system: Higher functions, cranial nerves intact no evidence of peripheral neuropathy. Skin: No rash.  No ecchymosis.Marland Kitchen   LAB RESULTS:  No visits with results within 2 Day(s) from this visit. Latest known visit  with results is:  Appointment on 06/13/2015  Component Date Value Ref Range Status  . WBC 06/13/2015 7.9  3.6 - 11.0 K/uL Final  . RBC 06/13/2015 5.28* 3.80 - 5.20 MIL/uL Final  . Hemoglobin 06/13/2015 15.2  12.0 - 16.0 g/dL Final  . HCT 06/13/2015 45.2  35.0 - 47.0 % Final  . MCV 06/13/2015 85.5  80.0 - 100.0 fL Final  . MCH 06/13/2015 28.7  26.0 - 34.0 pg Final  . MCHC 06/13/2015 33.6  32.0 - 36.0 g/dL Final  . RDW 06/13/2015 13.1  11.5 - 14.5 % Final  . Platelets 06/13/2015  279  150 - 440 K/uL Final  . Neutrophils Relative % 06/13/2015 50   Final  . Neutro Abs 06/13/2015 4.0  1.4 - 6.5 K/uL Final  . Lymphocytes Relative 06/13/2015 37   Final  . Lymphs Abs 06/13/2015 2.9  1.0 - 3.6 K/uL Final  . Monocytes Relative 06/13/2015 9   Final  . Monocytes Absolute 06/13/2015 0.7  0.2 - 0.9 K/uL Final  . Eosinophils Relative 06/13/2015 3   Final  . Eosinophils Absolute 06/13/2015 0.2  0 - 0.7 K/uL Final  . Basophils Relative 06/13/2015 1   Final  . Basophils Absolute 06/13/2015 0.1  0 - 0.1 K/uL Final  . Sodium 06/13/2015 137  135 - 145 mmol/L Final  . Potassium 06/13/2015 4.0  3.5 - 5.1 mmol/L Final  . Chloride 06/13/2015 102  101 - 111 mmol/L Final  . CO2 06/13/2015 26  22 - 32 mmol/L Final  . Glucose, Bld 06/13/2015 162* 65 - 99 mg/dL Final  . BUN 06/13/2015 26* 6 - 20 mg/dL Final  . Creatinine, Ser 06/13/2015 0.77  0.44 - 1.00 mg/dL Final  . Calcium 06/13/2015 9.6  8.9 - 10.3 mg/dL Final  . Total Protein 06/13/2015 7.8  6.5 - 8.1 g/dL Final  . Albumin 06/13/2015 4.1  3.5 - 5.0 g/dL Final  . AST 06/13/2015 17  15 - 41 U/L Final  . ALT 06/13/2015 26  14 - 54 U/L Final  . Alkaline Phosphatase 06/13/2015 84  38 - 126 U/L Final  . Total Bilirubin 06/13/2015 0.6  0.3 - 1.2 mg/dL Final  . GFR calc non Af Amer 06/13/2015 >60  >60 mL/min Final  . GFR calc Af Amer 06/13/2015 >60  >60 mL/min Final   Comment: (NOTE) The eGFR has been calculated using the CKD EPI equation. This calculation has not been validated in all clinical situations. eGFR's persistently <60 mL/min signify possible Chronic Kidney Disease.   . Anion gap 06/13/2015 9  5 - 15 Final  . T4, Total 06/13/2015 13.6* 4.5 - 12.0 ug/dL Final   Comment: (NOTE) Performed At: [facility] [place], [place] [id number] Lindon Romp MD WL:[id number]   . TSH 06/13/2015 0.098* 0.350 - 4.500 uIU/mL Final  . Thyroglobulin 06/13/2015 <2.0   Final   Comment: (NOTE) Reference  Range: Pubertal Children and Adults: <40 According to the [facility] of Clinical Biochemistry, the reference interval for Thyroglobulin (TG) should be related to euthyroid patients and not for patients who underwent thyroidectomy.  TG reference intervals for these patients depend on the residual mass of the thyroid tissue left after surgery.  Establishing a post-operative baseline is recommended.  The assay quantitation limit is 2.0 ng/mL. Performed At: [facility] [place], [place] [id number] Pep[place] Sheral Apley MD [id number]       ASSESSMENT: Carcinoma of the thyroid status post  radioactive I131 ablation T4 is slightly elevated to 13.6 Dose has been decreased to 150 g We recheck T4 TSH in 4-6 weeks and if needed nose will be reduced to 125 g On clinical examination and by left criteria patient does not have any evidence of progressing disease or recurrent thyroid cancer  MEDICAL DECISION MAKING: all lab data has been reviewed. T4 is slightly elevated Reduced dose of Synthroid to 112mg Patient expressed understanding and was in agreement with this plan. She also understands that She can call clinic at any time with any questions, concerns, or complaints.    No matching staging information was found for the patient.  JForest Gleason MD   06/20/2015 3:46 PM

## 2015-06-25 ENCOUNTER — Encounter: Payer: Self-pay | Admitting: Oncology

## 2015-07-01 IMAGING — CT CT HEAD WITHOUT CONTRAST
1 series · 16 of 30 positions shown, 20 images · non-contrast
Comparison: None.

CLINICAL DATA: Dizziness, hot feeling, and nausea.  Chest pain.

EXAM:
CT HEAD WITHOUT CONTRAST
TECHNIQUE: Contiguous axial images were obtained from the base of the skull
through the vertex without intravenous contrast.

[Series 2: head wo · axial · 0.40mm/px · z∈[+373,+499]mm · 16 of 32 slices shown, 20 images]
[im 2/32  brain]
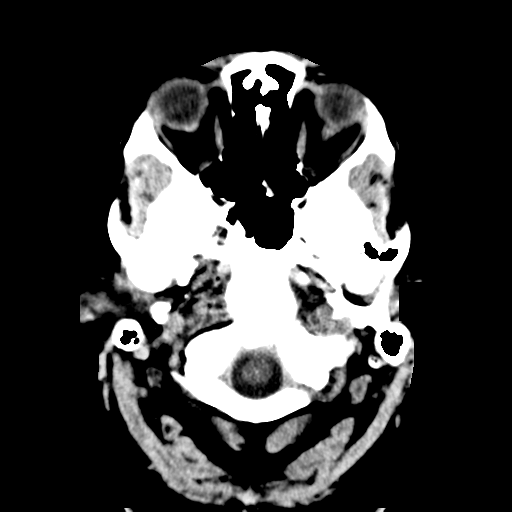
[im 2/32  bone]
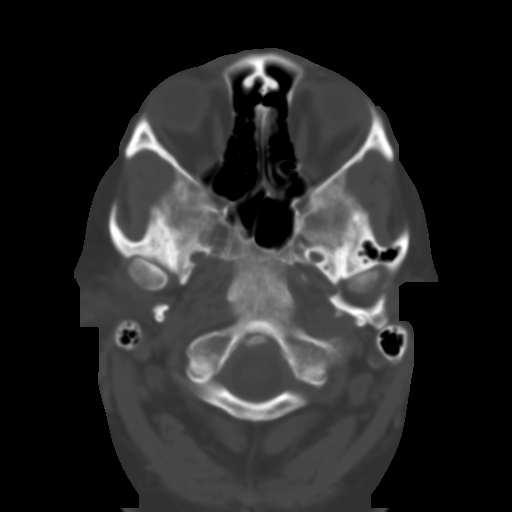
[im 4/32  brain]
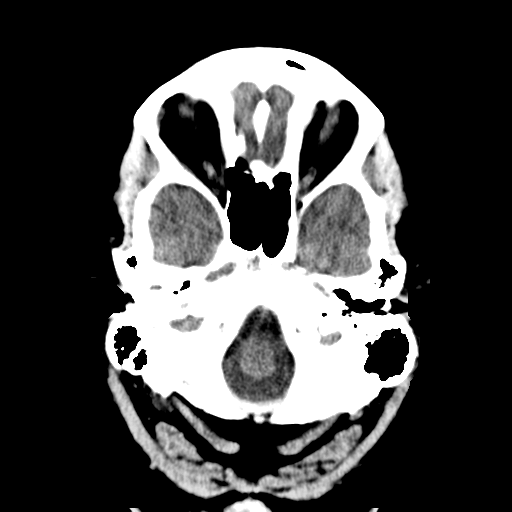
[im 6/32  brain]
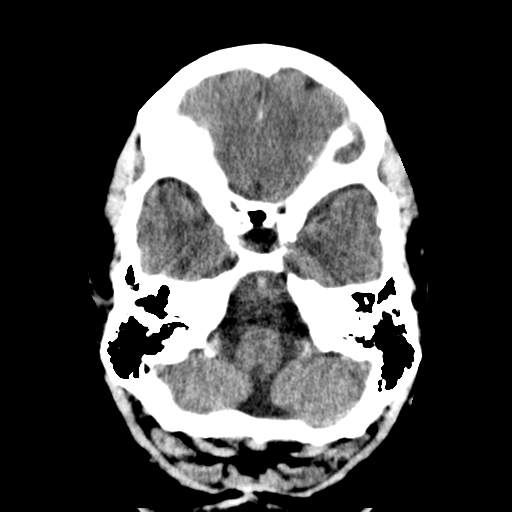
[im 8/32  brain]
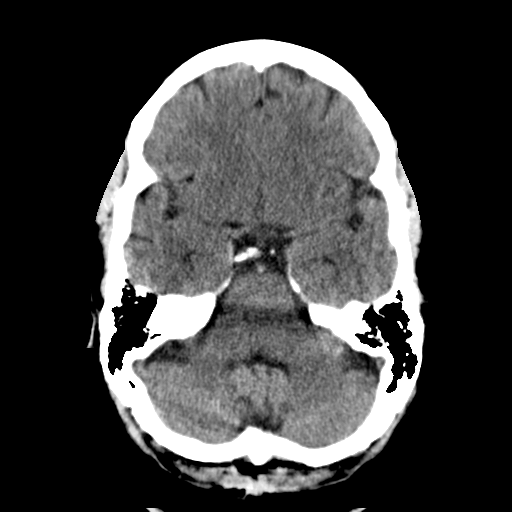
[im 9/32  brain]
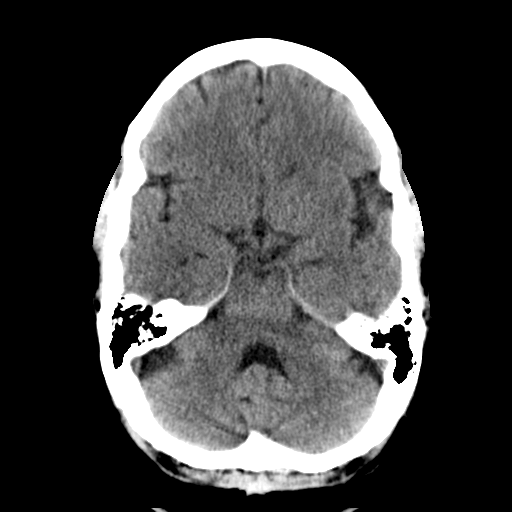
[im 9/32  bone]
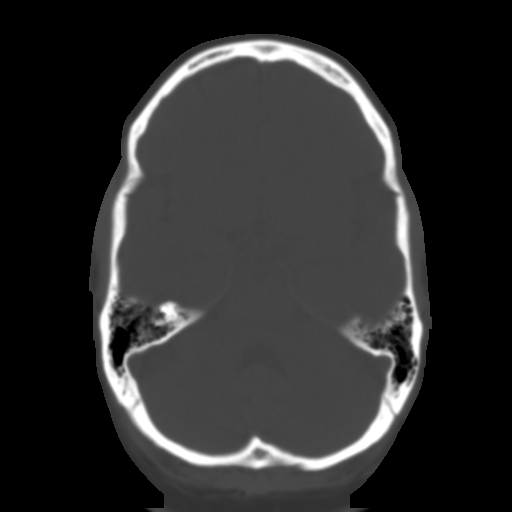
[im 11/32  brain]
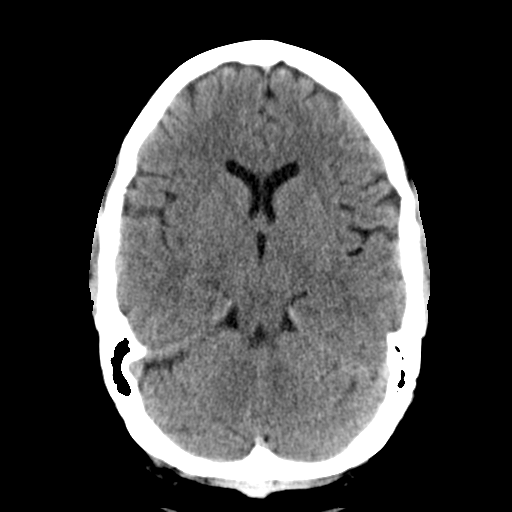
[im 13/32  brain]
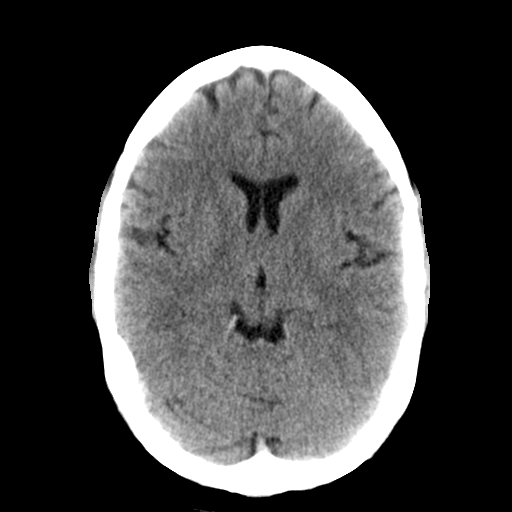
[im 15/32  brain]
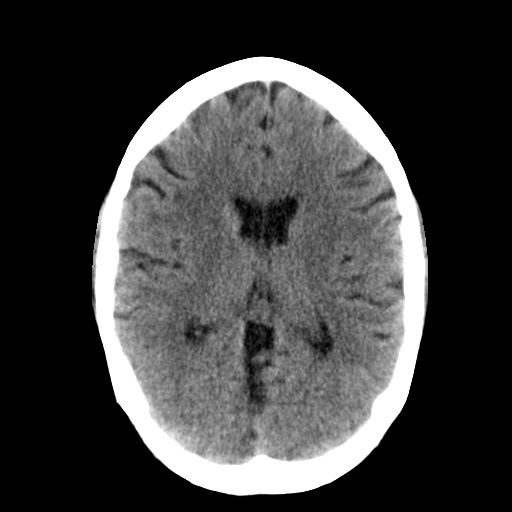
[im 17/32  brain]
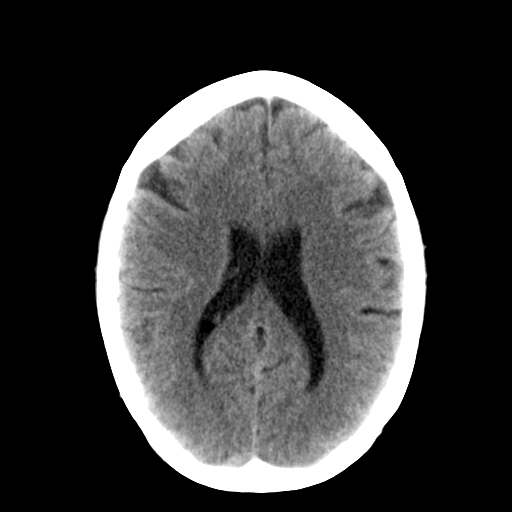
[im 17/32  bone]
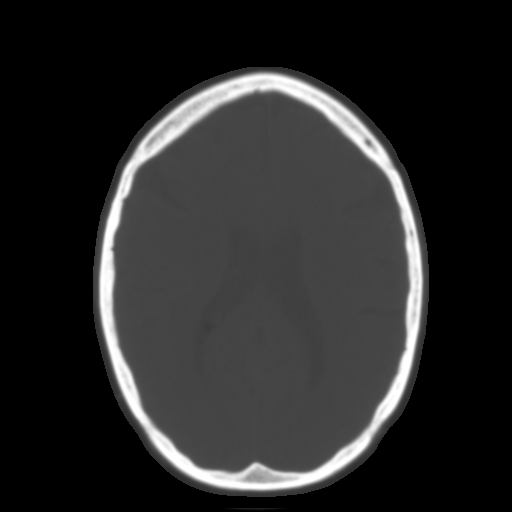
[im 19/32  brain]
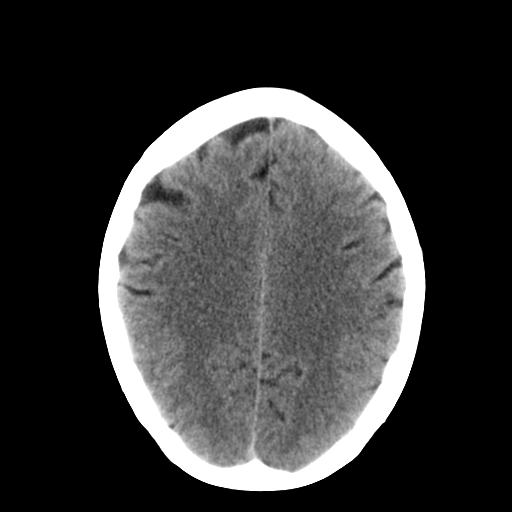
[im 21/32  brain]
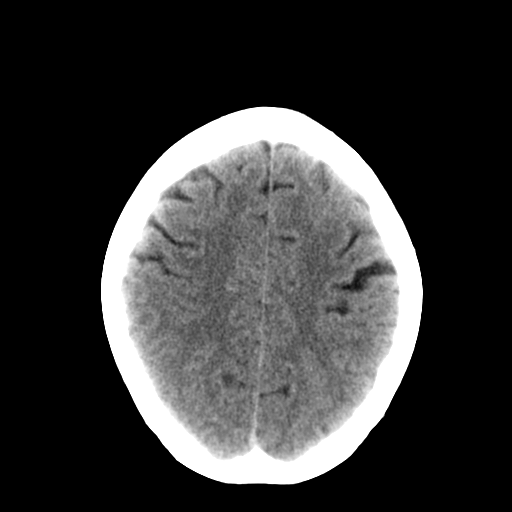
[im 23/32  brain]
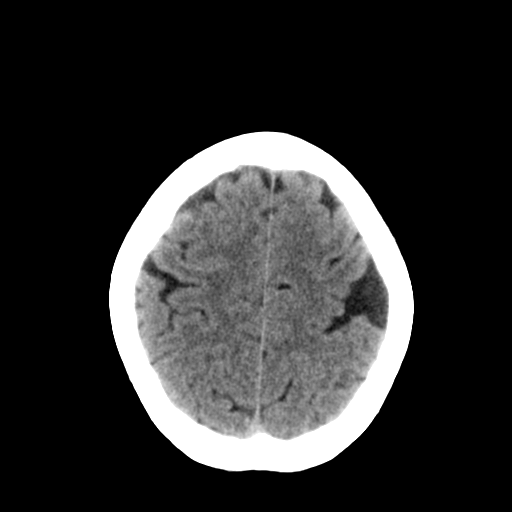
[im 24/32  brain]
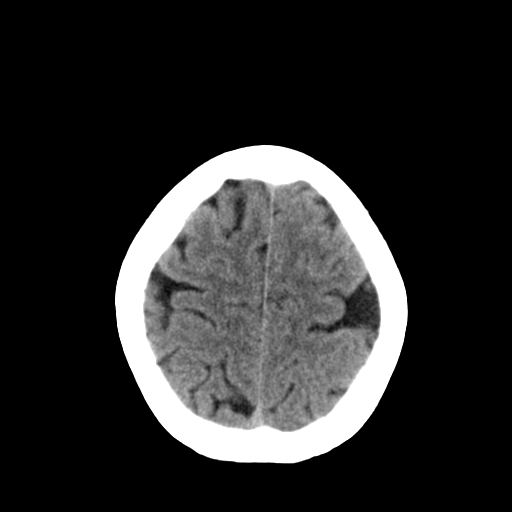
[im 24/32  bone]
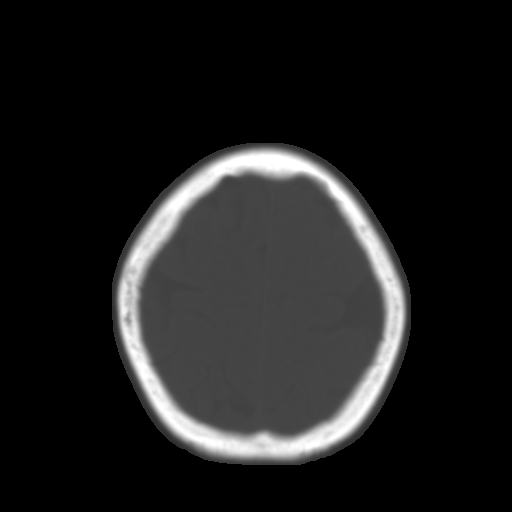
[im 26/32  brain]
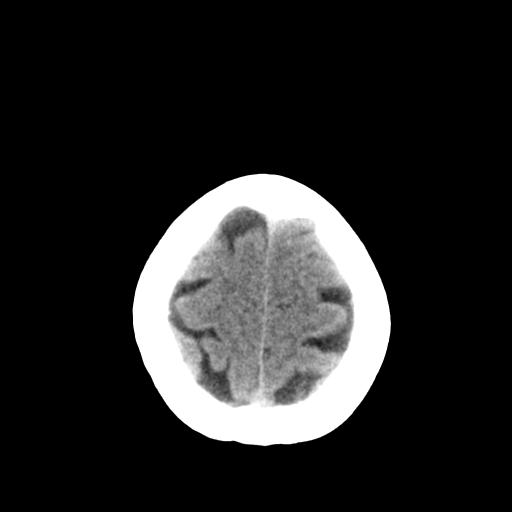
[im 28/32  brain]
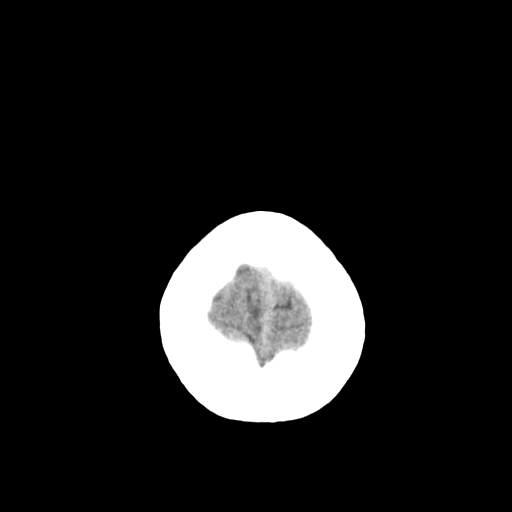
[im 30/32  brain]
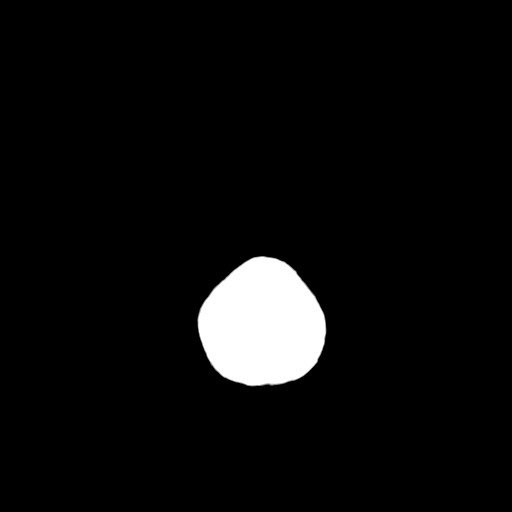

[16 of 30 positions shown; findings below may reference images not displayed]

FINDINGS: Mild frontal cerebral atrophy. Ventricles are not dilated. No mass
effect or midline shift. No abnormal extra-axial fluid collections.
Gray-white matter junctions are distinct. Basal cisterns are not
effaced. No evidence of acute intracranial hemorrhage. No depressed
skull fractures. Visualized paranasal sinuses and mastoid air cells
are not opacified.
IMPRESSION: No acute intracranial abnormalities.

## 2015-08-08 ENCOUNTER — Inpatient Hospital Stay: Payer: BLUE CROSS/BLUE SHIELD | Attending: Oncology

## 2015-08-08 DIAGNOSIS — Z85850 Personal history of malignant neoplasm of thyroid: Secondary | ICD-10-CM | POA: Diagnosis present

## 2015-08-08 DIAGNOSIS — Z923 Personal history of irradiation: Secondary | ICD-10-CM | POA: Insufficient documentation

## 2015-08-08 DIAGNOSIS — E890 Postprocedural hypothyroidism: Secondary | ICD-10-CM | POA: Insufficient documentation

## 2015-08-08 DIAGNOSIS — C73 Malignant neoplasm of thyroid gland: Secondary | ICD-10-CM

## 2015-08-08 LAB — TSH: TSH: 1.192 u[IU]/mL (ref 0.350–4.500)

## 2015-08-09 LAB — T4: T4, Total: 11.7 ug/dL (ref 4.5–12.0)

## 2015-12-18 ENCOUNTER — Inpatient Hospital Stay: Payer: Self-pay

## 2015-12-25 ENCOUNTER — Ambulatory Visit: Payer: BLUE CROSS/BLUE SHIELD | Admitting: Oncology

## 2015-12-26 ENCOUNTER — Ambulatory Visit: Payer: BLUE CROSS/BLUE SHIELD | Admitting: Oncology

## 2017-02-09 ENCOUNTER — Other Ambulatory Visit: Payer: Self-pay | Admitting: Surgery

## 2017-02-09 ENCOUNTER — Other Ambulatory Visit: Payer: Self-pay | Admitting: Family Medicine

## 2017-02-09 DIAGNOSIS — Z1231 Encounter for screening mammogram for malignant neoplasm of breast: Secondary | ICD-10-CM

## 2020-02-21 ENCOUNTER — Telehealth: Payer: Self-pay | Admitting: Oncology

## 2020-02-21 NOTE — Telephone Encounter (Signed)
[place], MD [place]. [place], [place] Fax: [phone]
